# Patient Record
Sex: Male | Born: 1960 | Race: Black or African American | Hispanic: No | Marital: Married | Smoking: Never smoker
Health system: Southern US, Community
[De-identification: ages and names within clinical notes are randomized; demographics above are authoritative.]

## PROBLEM LIST (undated history)

## (undated) DIAGNOSIS — F32A Depression, unspecified: Secondary | ICD-10-CM

## (undated) DIAGNOSIS — E0500 Thyrotoxicosis with diffuse goiter without thyrotoxic crisis or storm: Secondary | ICD-10-CM

## (undated) DIAGNOSIS — F419 Anxiety disorder, unspecified: Secondary | ICD-10-CM

## (undated) DIAGNOSIS — F329 Major depressive disorder, single episode, unspecified: Secondary | ICD-10-CM

## (undated) DIAGNOSIS — T7840XA Allergy, unspecified, initial encounter: Secondary | ICD-10-CM

## (undated) DIAGNOSIS — E039 Hypothyroidism, unspecified: Secondary | ICD-10-CM

## (undated) HISTORY — DX: Anxiety disorder, unspecified: F41.9

## (undated) HISTORY — DX: Depression, unspecified: F32.A

## (undated) HISTORY — PX: KNEE SURGERY: SHX244

## (undated) HISTORY — DX: Allergy, unspecified, initial encounter: T78.40XA

---
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
## 2000-03-30 ENCOUNTER — Encounter: Payer: Self-pay | Admitting: Emergency Medicine

## 2000-03-30 ENCOUNTER — Emergency Department (HOSPITAL_COMMUNITY): Admission: EM | Admit: 2000-03-30 | Discharge: 2000-03-30 | Payer: Self-pay | Admitting: Emergency Medicine

## 2000-03-30 ENCOUNTER — Encounter: Payer: Self-pay | Admitting: Family Medicine

## 2000-10-03 ENCOUNTER — Ambulatory Visit (HOSPITAL_COMMUNITY): Admission: RE | Admit: 2000-10-03 | Discharge: 2000-10-03 | Payer: Self-pay | Admitting: Orthopedic Surgery

## 2000-10-03 ENCOUNTER — Encounter: Payer: Self-pay | Admitting: Orthopedic Surgery

## 2003-04-25 ENCOUNTER — Emergency Department (HOSPITAL_COMMUNITY): Admission: EM | Admit: 2003-04-25 | Discharge: 2003-04-25 | Payer: Self-pay | Admitting: Emergency Medicine

## 2004-08-17 ENCOUNTER — Emergency Department (HOSPITAL_COMMUNITY): Admission: EM | Admit: 2004-08-17 | Discharge: 2004-08-17 | Payer: Self-pay | Admitting: Emergency Medicine

## 2005-05-16 ENCOUNTER — Emergency Department (HOSPITAL_COMMUNITY): Admission: EM | Admit: 2005-05-16 | Discharge: 2005-05-16 | Payer: Self-pay | Admitting: Emergency Medicine

## 2006-06-01 ENCOUNTER — Emergency Department (HOSPITAL_COMMUNITY): Admission: EM | Admit: 2006-06-01 | Discharge: 2006-06-02 | Payer: Self-pay | Admitting: Emergency Medicine

## 2006-10-16 ENCOUNTER — Encounter: Admission: RE | Admit: 2006-10-16 | Discharge: 2006-10-16 | Payer: Self-pay | Admitting: Neurosurgery

## 2007-02-06 ENCOUNTER — Emergency Department (HOSPITAL_COMMUNITY): Admission: EM | Admit: 2007-02-06 | Discharge: 2007-02-06 | Payer: Self-pay | Admitting: Emergency Medicine

## 2010-02-27 ENCOUNTER — Emergency Department (HOSPITAL_COMMUNITY): Admission: EM | Admit: 2010-02-27 | Discharge: 2010-02-27 | Payer: Self-pay | Admitting: Emergency Medicine

## 2010-05-19 ENCOUNTER — Emergency Department (HOSPITAL_COMMUNITY): Admission: EM | Admit: 2010-05-19 | Discharge: 2010-05-19 | Payer: Self-pay | Admitting: Emergency Medicine

## 2010-10-31 LAB — DIFFERENTIAL
Eosinophils Absolute: 0.1 10*3/uL (ref 0.0–0.7)
Lymphocytes Relative: 17 % (ref 12–46)
Lymphs Abs: 1.8 10*3/uL (ref 0.7–4.0)
Monocytes Relative: 4 % (ref 3–12)
Neutrophils Relative %: 78 % — ABNORMAL HIGH (ref 43–77)

## 2010-10-31 LAB — CBC
HCT: 45.5 % (ref 39.0–52.0)
Hemoglobin: 15.3 g/dL (ref 13.0–17.0)
MCV: 87.4 fL (ref 78.0–100.0)
RDW: 13.8 % (ref 11.5–15.5)

## 2010-10-31 LAB — URINALYSIS, ROUTINE W REFLEX MICROSCOPIC
Bilirubin Urine: NEGATIVE
Glucose, UA: NEGATIVE mg/dL
Hgb urine dipstick: NEGATIVE
Leukocytes, UA: NEGATIVE
Protein, ur: 30 mg/dL — AB
Urobilinogen, UA: 0.2 mg/dL (ref 0.0–1.0)
pH: 8.5 — ABNORMAL HIGH (ref 5.0–8.0)

## 2010-10-31 LAB — COMPREHENSIVE METABOLIC PANEL
ALT: 22 U/L (ref 0–53)
Albumin: 4.3 g/dL (ref 3.5–5.2)
Alkaline Phosphatase: 56 U/L (ref 39–117)
BUN: 13 mg/dL (ref 6–23)
Calcium: 9.7 mg/dL (ref 8.4–10.5)
Creatinine, Ser: 1.36 mg/dL (ref 0.4–1.5)
Potassium: 2.9 mEq/L — ABNORMAL LOW (ref 3.5–5.1)
Total Bilirubin: 0.9 mg/dL (ref 0.3–1.2)
Total Protein: 7.2 g/dL (ref 6.0–8.3)

## 2010-10-31 LAB — LIPASE, BLOOD: Lipase: 44 U/L (ref 11–59)

## 2010-10-31 LAB — URINE MICROSCOPIC-ADD ON

## 2010-11-03 LAB — DIFFERENTIAL
Eosinophils Absolute: 0.1 10*3/uL (ref 0.0–0.7)
Eosinophils Relative: 1 % (ref 0–5)
Lymphocytes Relative: 16 % (ref 12–46)
Monocytes Absolute: 0.7 10*3/uL (ref 0.1–1.0)
Neutro Abs: 8.5 10*3/uL — ABNORMAL HIGH (ref 1.7–7.7)
Neutrophils Relative %: 76 % (ref 43–77)

## 2010-11-03 LAB — CBC
HCT: 43.3 % (ref 39.0–52.0)
MCV: 86.5 fL (ref 78.0–100.0)
Platelets: 247 10*3/uL (ref 150–400)
WBC: 11.2 10*3/uL — ABNORMAL HIGH (ref 4.0–10.5)

## 2010-11-03 LAB — URINE MICROSCOPIC-ADD ON

## 2010-11-03 LAB — URINALYSIS, ROUTINE W REFLEX MICROSCOPIC
Bilirubin Urine: NEGATIVE
Glucose, UA: NEGATIVE mg/dL
Ketones, ur: NEGATIVE mg/dL
Leukocytes, UA: NEGATIVE
Nitrite: NEGATIVE
Protein, ur: 30 mg/dL — AB
Specific Gravity, Urine: 1.022 (ref 1.005–1.030)
Urobilinogen, UA: 0.2 mg/dL (ref 0.0–1.0)
pH: 7 (ref 5.0–8.0)

## 2010-11-03 LAB — BASIC METABOLIC PANEL
Creatinine, Ser: 1.41 mg/dL (ref 0.4–1.5)
GFR calc Af Amer: >60 mL/min (ref >60)
Glucose, Bld: 155 mg/dL — ABNORMAL HIGH (ref 70–99)
Potassium: 3.5 mEq/L (ref 3.5–5.1)
Sodium: 139 mEq/L (ref 135–145)

## 2011-02-25 IMAGING — CT CT ABD-PELV W/O CM
2 of 4 series · 17 of 46 positions shown, 19 images · non-contrast
Comparison: None.

CLINICAL DATA: Abdominal pain.  Right flank pain.

CT ABDOMEN AND PELVIS WITHOUT CONTRAST
TECHNIQUE: Multidetector CT imaging of the abdomen and pelvis was
performed following the standard protocol without intravenous
contrast.

[Series 2: under 200# stone no prev · axial · 0.74mm/px · z∈[-502,-82]mm · 14 of 93 slices shown, 16 images]
[im 5/93  soft-tissue]
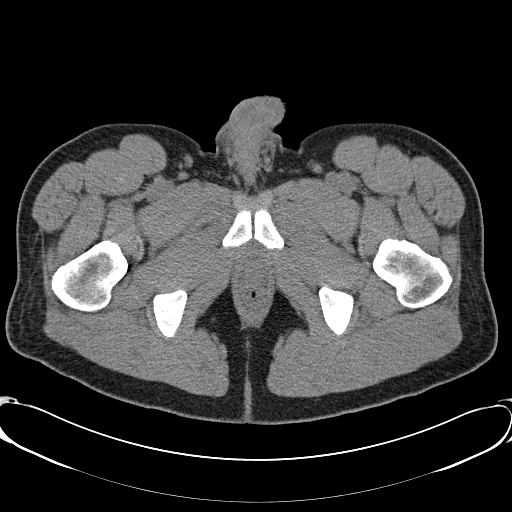
[im 5/93  bone]
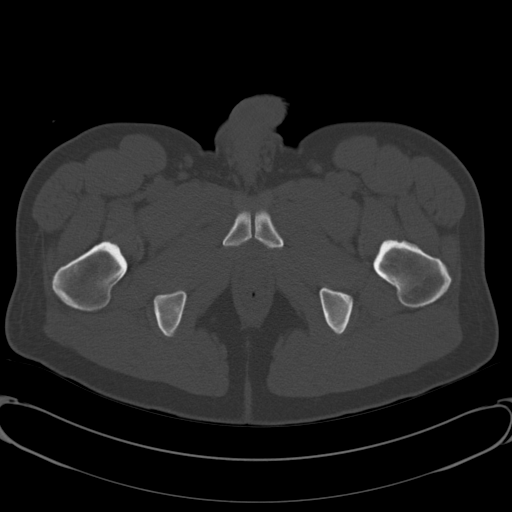
[im 13/93  soft-tissue]
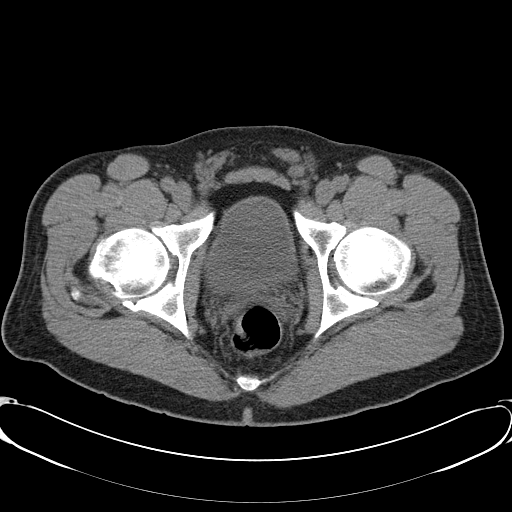
[im 17/93  soft-tissue]
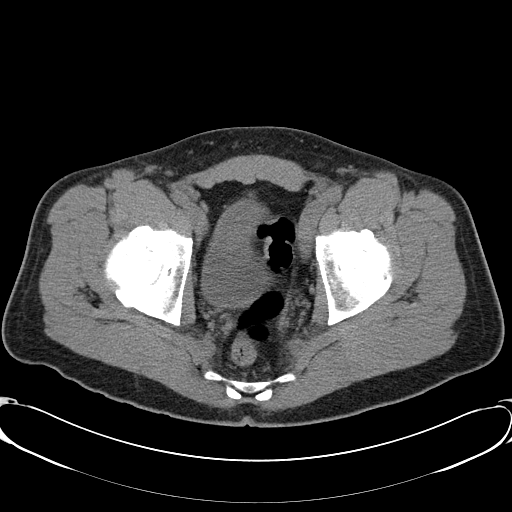
[im 25/93  soft-tissue]
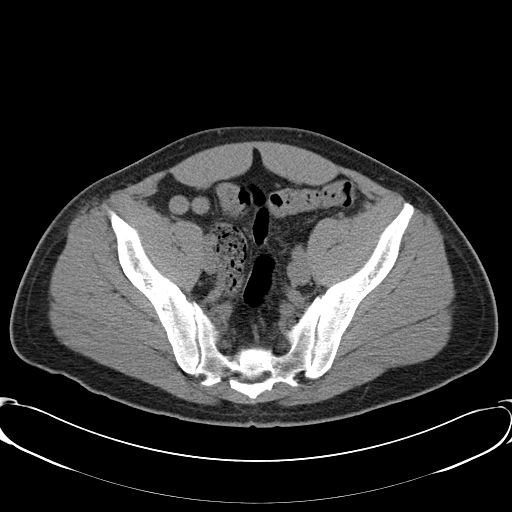
[im 33/93  soft-tissue]
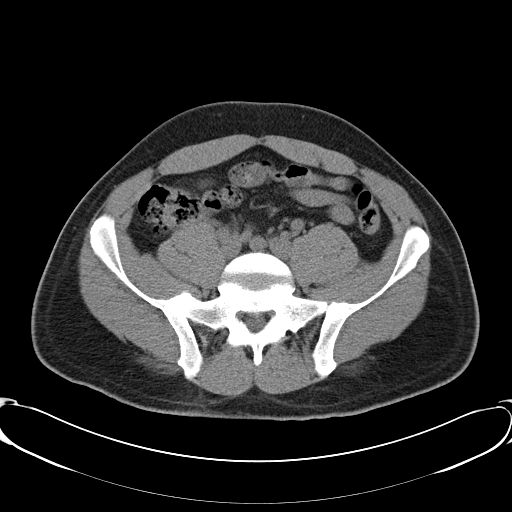
[im 37/93  soft-tissue]
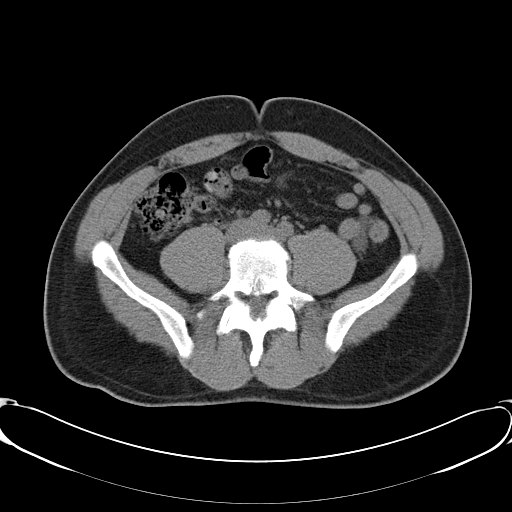
[im 45/93  soft-tissue]
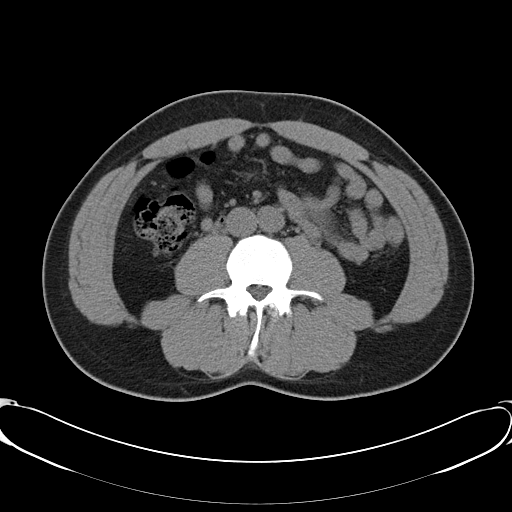
[im 49/93  soft-tissue]
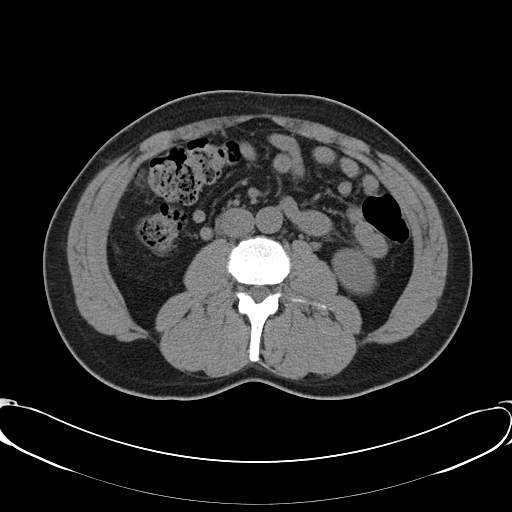
[im 57/93  soft-tissue]
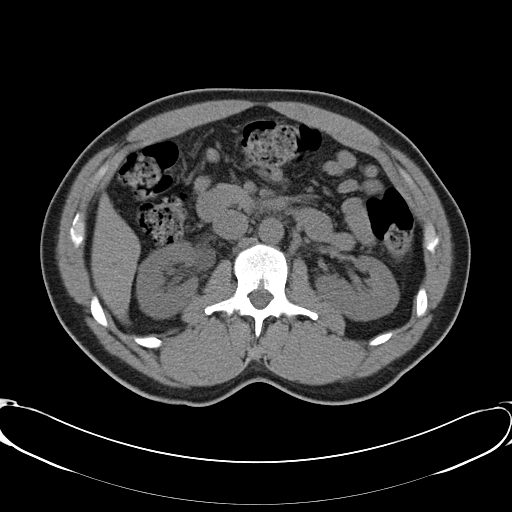
[im 57/93  bone]
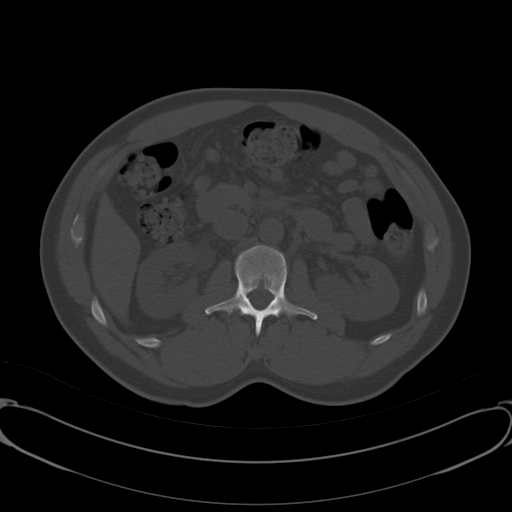
[im 61/93  soft-tissue]
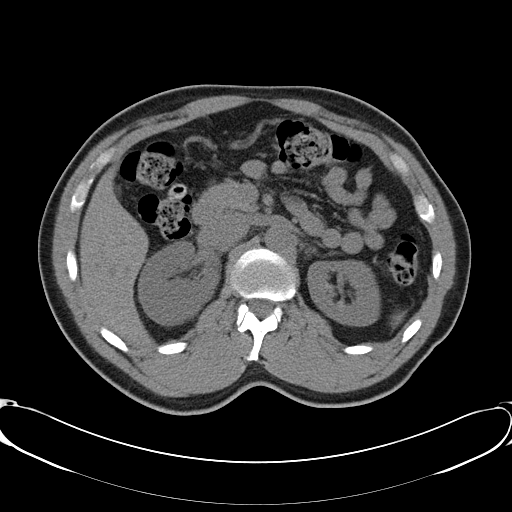
[im 69/93  soft-tissue]
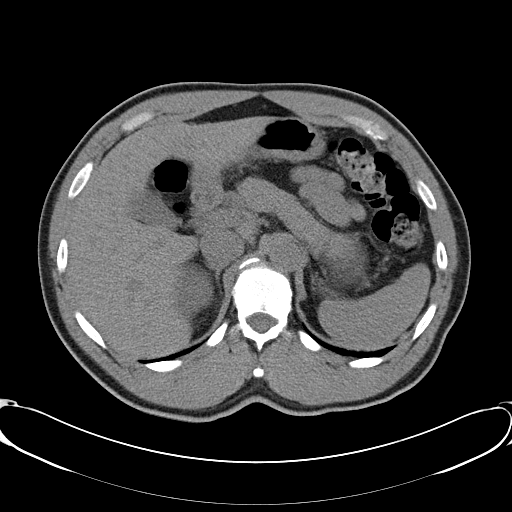
[im 77/93  soft-tissue]
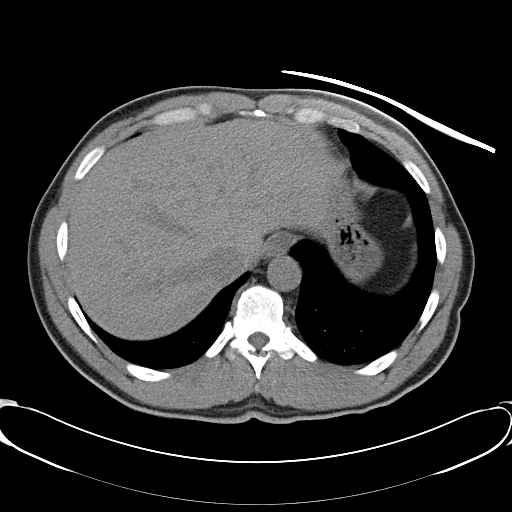
[im 81/93  soft-tissue]
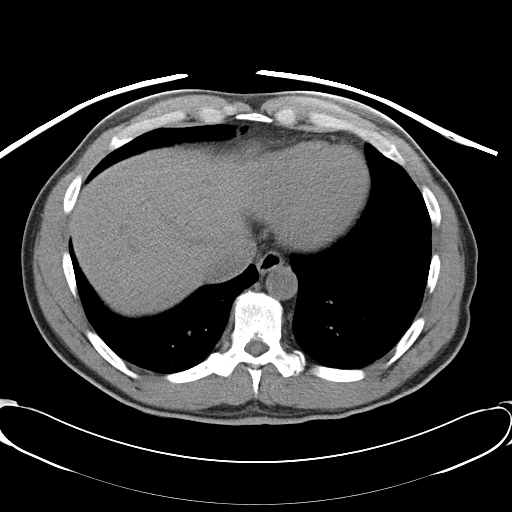
[im 89/93  soft-tissue]
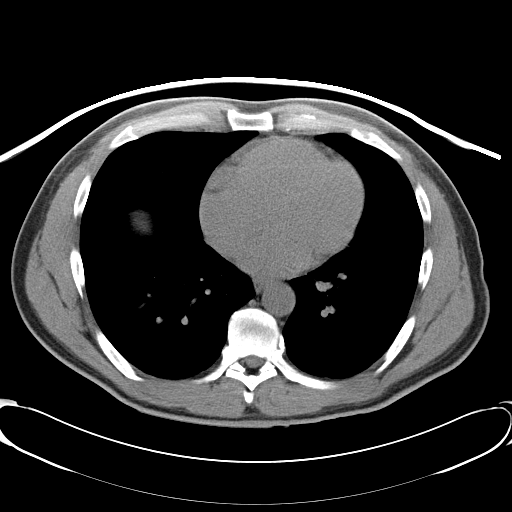

[Series 602: coronal images · coronal · 0.91mm/px · 3 of 79 slices shown]
[im 27/79  soft-tissue]
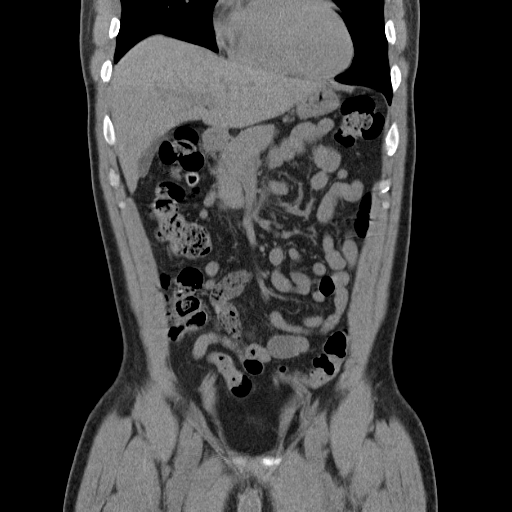
[im 35/79  soft-tissue]
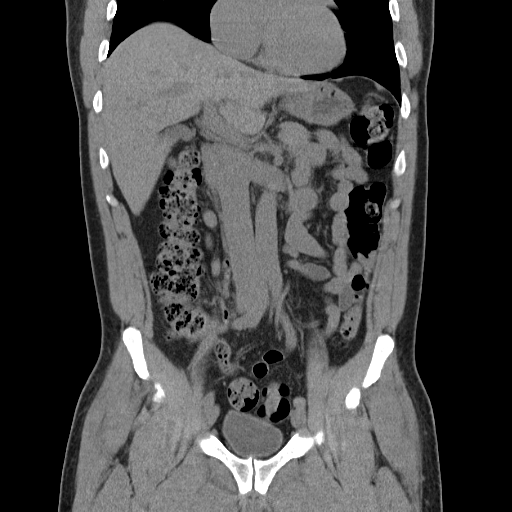
[im 44/79  soft-tissue]
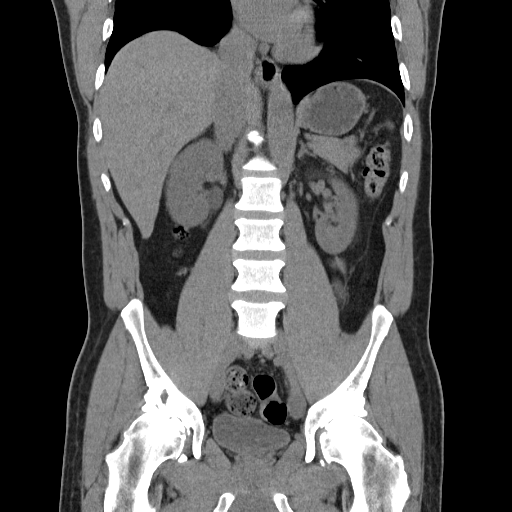

[17 of 46 positions shown; findings below may reference images not displayed]

FINDINGS: Minimal dependent atelectasis is present at the lung
bases bilaterally.  The heart size is normal.  No significant
pleural or pericardial effusion is present.

The liver and spleen are within normal limits.  The stomach,
duodenum, and pancreas are within normal limits.  The common bile
duct and gallbladder are normal.  The adrenal glands are normal
bilaterally.  Mild right-sided hydronephrosis is present.  A 4.5 mm
obstructing stone is present at the level of the L3-4 disc. The
ureter is dilated above this point.  No significant distal stones
or stone fragments are present.  A punctate nonobstructing stone is
noted at the upper pole of the left kidney.  The left ureter is of
normal size and caliber throughout its course into the anatomic
pelvis.

Rectosigmoid colon is within normal limits.  The remainder the
colon is unremarkable.  The appendix is visualized and normal.
Urinary bladder is unremarkable.  No significant adenopathy or free
fluid is present.

Bone windows demonstrate advanced degenerative changes of both
hips.  There are large subchondral cysts or geodes.  There may be
erosions medially.  Significant loss of joint space is present.
IMPRESSION: 1.  Obstructing 4.5 mm right mid ureteral stone with mild right-
sided hydronephrosis.
2.  Nonobstructing punctate left upper pole kidney stone.
3.  Advanced degenerative changes of both hips.  While this could
be due to post-traumatic osteoarthritis and patient of this age,
inflammatory arthritis such as CPPD or gout is also considered.

## 2011-06-04 LAB — WOUND CULTURE

## 2012-07-13 ENCOUNTER — Emergency Department (HOSPITAL_COMMUNITY)
Admission: EM | Admit: 2012-07-13 | Discharge: 2012-07-13 | Disposition: A | Discharge status: Home / Self Care | Payer: Self-pay | Attending: Emergency Medicine | Admitting: Emergency Medicine

## 2012-07-13 ENCOUNTER — Encounter (HOSPITAL_COMMUNITY): Payer: Self-pay | Admitting: *Deleted

## 2012-07-13 ENCOUNTER — Emergency Department (HOSPITAL_COMMUNITY): Payer: Self-pay

## 2012-07-13 DIAGNOSIS — R0781 Pleurodynia: Secondary | ICD-10-CM

## 2012-07-13 DIAGNOSIS — R079 Chest pain, unspecified: Secondary | ICD-10-CM | POA: Insufficient documentation

## 2012-07-13 MED ORDER — IBUPROFEN 800 MG PO TABS
800.0000 mg | ORAL_TABLET | Freq: Once | ORAL | Status: AC
  Administered 2012-07-13: 800 mg via ORAL
  Filled 2012-07-13: qty 1

## 2012-07-13 MED ORDER — HYDROCODONE-ACETAMINOPHEN 5-325 MG PO TABS: 1.0000 | ORAL_TABLET | Freq: Four times a day (QID) | ORAL | Status: DC | PRN

## 2012-07-13 NOTE — ED Provider Notes (Signed)
History     CSN: [id number]  Arrival date & time 07/13/12  4098   First MD Initiated Contact with Patient 07/13/12 1033      No chief complaint on file.   (Consider location/radiation/quality/duration/timing/severity/associated sxs/prior treatment) HPI Comments: 51 year old male with a significant past medical history presents emergency department with chief complaint of left rib pain.  Onset of symptoms began yesterday after patient was wrestling with a martial Water engineer.  Since the incident left rib pain occurs when coughing or laughing.  He denies any pleuritic pain, chest pain, numbness tingling or weakness, difficulty with range of motion.  Other complaints at this time.  The history is provided by the patient.    History reviewed. No pertinent past medical history.  Past Surgical History  Procedure Date  . Knee surgery     cyst removed/fluid buildup     History reviewed. No pertinent family history.  History  Substance Use Topics  . Smoking status: Never Smoker   . Smokeless tobacco: Never Used  . Alcohol Use: Yes      Review of Systems  Constitutional: Negative for fever, chills and appetite change.  HENT: Negative for congestion.   Eyes: Negative for visual disturbance.  Respiratory: Negative for shortness of breath.   Cardiovascular: Negative for chest pain and leg swelling.  Gastrointestinal: Negative for abdominal pain.  Genitourinary: Negative for dysuria, urgency and frequency.  Musculoskeletal: Positive for back pain (rib pain ).  Neurological: Negative for dizziness, syncope, weakness, light-headedness, numbness and headaches.  Psychiatric/Behavioral: Negative for confusion.  All other systems reviewed and are negative.    Allergies  Pork-derived products  Home Medications   Current Outpatient Rx  Name  Route  Sig  Dispense  Refill  . IBUPROFEN 200 MG PO TABS   Oral   Take 200 mg by mouth every 6 (six) hours as needed. Pain            BP 124/80  Pulse 60  Temp 98.2 F (36.8 C) (Oral)  Resp 18  SpO2 100%  Physical Exam  Nursing note and vitals reviewed. Constitutional: He appears well-developed and well-nourished. No distress.  HENT:  Head: Normocephalic and atraumatic.  Eyes: Conjunctivae normal and EOM are normal.  Neck: Normal range of motion. Neck supple.  Cardiovascular:       Intact distal pulses, capillary refill < 3 seconds  Pulmonary/Chest:       ttp along left 4 or 5th rib  Musculoskeletal:       All extremities with normal ROM  Neurological:       No sensory deficit  Skin: He is not diaphoretic.       Skin intact, no tenting    ED Course  Procedures (including critical care time)  Labs Reviewed - No data to display Dg Ribs Unilateral W/chest Left  07/13/2012  *RADIOLOGY REPORT*  Clinical Data: Left rib pain.  LEFT RIBS AND CHEST - 3+ VIEW  Comparison: None  Findings: The cardiac silhouette, mediastinal and hilar contours are normal.  The lungs are clear.  No pleural effusion, pleural thickening or pneumothorax.  Dedicated views of the left ribs demonstrate no definite acute left- sided rib fractures.  IMPRESSION:  1.  No acute cardiopulmonary findings. 2.  No definite acute left-sided rib fractures.   Original Report Authenticated By: Rudie Meyer, M.D.      No diagnosis found.    MDM  Rib pain   Patient X-Ray negative for obvious fracture or dislocation. Pt  advised to follow up with orthopedics if symptoms persist for possibility of missed fracture diagnosis.  Conservative therapy recommended and discussed. Patient will be dc home & is agreeable with above plan.         Jaci Carrel, [place] 07/13/12 1144

## 2012-07-13 NOTE — ED Provider Notes (Signed)
Medical screening examination/treatment/procedure(s) were performed by non-physician practitioner and as supervising physician I was immediately available for consultation/collaboration.  Jones Skene, M.D.      Jones Skene, MD 07/13/12 1304
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
## 2016-02-29 ENCOUNTER — Encounter (HOSPITAL_COMMUNITY): Payer: Self-pay

## 2016-02-29 ENCOUNTER — Emergency Department (HOSPITAL_COMMUNITY): Payer: Self-pay

## 2016-02-29 ENCOUNTER — Emergency Department (HOSPITAL_COMMUNITY)
Admission: EM | Admit: 2016-02-29 | Discharge: 2016-02-29 | Disposition: A | Discharge status: Home / Self Care | Payer: Self-pay | Attending: Emergency Medicine | Admitting: Emergency Medicine

## 2016-02-29 DIAGNOSIS — Z791 Long term (current) use of non-steroidal anti-inflammatories (NSAID): Secondary | ICD-10-CM | POA: Insufficient documentation

## 2016-02-29 DIAGNOSIS — F439 Reaction to severe stress, unspecified: Secondary | ICD-10-CM | POA: Insufficient documentation

## 2016-02-29 DIAGNOSIS — F329 Major depressive disorder, single episode, unspecified: Secondary | ICD-10-CM | POA: Insufficient documentation

## 2016-02-29 DIAGNOSIS — Z5181 Encounter for therapeutic drug level monitoring: Secondary | ICD-10-CM | POA: Insufficient documentation

## 2016-02-29 DIAGNOSIS — R4589 Other symptoms and signs involving emotional state: Secondary | ICD-10-CM

## 2016-02-29 DIAGNOSIS — M1611 Unilateral primary osteoarthritis, right hip: Secondary | ICD-10-CM | POA: Insufficient documentation

## 2016-02-29 DIAGNOSIS — M25551 Pain in right hip: Secondary | ICD-10-CM

## 2016-02-29 LAB — URINALYSIS, ROUTINE W REFLEX MICROSCOPIC
BILIRUBIN URINE: NEGATIVE
Glucose, UA: NEGATIVE mg/dL
HGB URINE DIPSTICK: NEGATIVE
KETONES UR: NEGATIVE mg/dL
Leukocytes, UA: NEGATIVE
NITRITE: NEGATIVE
PH: 7 (ref 5.0–8.0)
Protein, ur: NEGATIVE mg/dL
Specific Gravity, Urine: 1.022 (ref 1.005–1.030)

## 2016-02-29 LAB — ETHANOL: Alcohol, Ethyl (B): <5 mg/dL (ref <5)

## 2016-02-29 LAB — SALICYLATE LEVEL

## 2016-02-29 LAB — CBC
HEMATOCRIT: 41.7 % (ref 39.0–52.0)
Hemoglobin: 13.9 g/dL (ref 13.0–17.0)
MCH: 27.9 pg (ref 26.0–34.0)
MCHC: 33.3 g/dL (ref 30.0–36.0)
MCV: 83.6 fL (ref 78.0–100.0)
Platelets: 265 10*3/uL (ref 150–400)
RBC: 4.99 MIL/uL (ref 4.22–5.81)
RDW: 13.2 % (ref 11.5–15.5)
WBC: 5.4 10*3/uL (ref 4.0–10.5)

## 2016-02-29 LAB — BASIC METABOLIC PANEL
ANION GAP: 4 — AB (ref 5–15)
BUN: 11 mg/dL (ref 6–20)
CALCIUM: 9.2 mg/dL (ref 8.9–10.3)
CO2: 26 mmol/L (ref 22–32)
Chloride: 110 mmol/L (ref 101–111)
Creatinine, Ser: 0.95 mg/dL (ref 0.61–1.24)
GFR calc Af Amer: >60 mL/min (ref >60)
Glucose, Bld: 88 mg/dL (ref 65–99)
POTASSIUM: 4.3 mmol/L (ref 3.5–5.1)
SODIUM: 140 mmol/L (ref 135–145)

## 2016-02-29 LAB — RAPID URINE DRUG SCREEN, HOSP PERFORMED
Amphetamines: NOT DETECTED
Barbiturates: NOT DETECTED
Benzodiazepines: NOT DETECTED
Cocaine: NOT DETECTED
Opiates: NOT DETECTED
Tetrahydrocannabinol: POSITIVE — AB

## 2016-02-29 LAB — CBG MONITORING, ED: Glucose-Capillary: 81 mg/dL (ref 65–99)

## 2016-02-29 LAB — ACETAMINOPHEN LEVEL: Acetaminophen (Tylenol), Serum: <10 ug/mL — ABNORMAL LOW (ref 10–30)

## 2016-02-29 NOTE — Discharge Instructions (Signed)
Community Resource Guide Outpatient Counseling/Substance Abuse Adult °The United Way’s “211” is a great source of information about community services available.  Access by dialing 2-1-1 from anywhere in [place], or by website -  www.nc211.org.  ° °Other Local Resources (Updated 08/2015) ° °Crisis Hotlines °  °Services  ° °  °Area Served  °[facility] • Crisis Hotline, available 24 hours a day, 7 days a week: 800-939-5911 [place], [place]  ° [facility] • Crisis Hotline, available 24 hours a day, 7 days a week: 866-275-9552 [place], [place]  °[facility] • Suicide Prevention Hotline, available 24 hours a day, 7 days a week: 800-273-8255 [place], [place]  °[facility] ° • Crisis Hotline, available 24 hours a day, 7 days a week: 336-676-6840 [place], [place] °  °[facility] • Crisis Hotline, available 24 hours a day, 7 days a week: 800-256-2452 All °  °[facility] • Crisis Hotline, available 24 hours a day, 7 days a week: 877-626-1772 All  ° °Other Local Resources (Updated 08/2015) ° °Outpatient Counseling/ Substance Abuse Programs  °Services  ° °  °Address and Phone Number  °[facility] ° • Options include Individual counseling, group counseling, intensive outpatient program (several hours a day, several days a week) °• Offers depression assessments °• Provides methadone maintenance program 336-333-6860 °[address] °[place], [place] [address] °  °[facility] ° • Offers partial hospitalization/day treatment and DUI/DWI programs °• Accepts Medicare, private insurance 336-299-4655 °[address], [address] °[place], [place] [address]  °[facility] ° ° • Services include intensive outpatient program (several hours a day, several days a week), outpatient treatment, DUI/DWI services, family education °• Also has some services specifically for Veterans °• Offers transitional housing   [phone] °[address] °[place], [place] [address] °  °  °[facility] • Accepts Medicare, private pay, and private insurance [phone] °[address] °[place], [place] [address]  °[facility] • Services include individual counseling, substance abuse intensive outpatient program (several hours a day, several days a week), day treatment °• Accepts Medicare, Medicaid, private insurance 336-271-5888 °[address] °[place], [place] [address]  °[facility] ° • Offers substance abuse intensive outpatient program (several hours a day, several days a week), partial hospitalization program 336-832-9800 °[address] °[place], [place] [address] ° °336-349-4454 °[address] °[place], [place] [address] ° °336-386-3795 °[address] °[place], [place] [address] ° °[phone] °[address] °[place], [place] [address]  °[facility] • Individual counseling only °• Accepts private insurance only 336-292-1510 °[address] °[place], [place] [address]  °[facility] • Methadone maintenance program [phone] °[address] °[place], [place] [address]  °[facility] • [facility]-In Clinic providing substance abuse and mental health counseling °• Accepts Medicaid, Medicare, private insurance °• Offers sliding scale for uninsured 336-342-8316 °[address] °[place], [place]   °[facility] • Offers individual counseling, and intensive in-home services 336-347-7415 °[address] °[place], [place] [address]  °[facility] • Offers individual counseling, family counseling, group therapy, domestic violence counseling, consumer credit counseling °• Accepts Medicare, Medicaid, private insurance °• Offers sliding scale for uninsured 336-387-6161 °[address] °[place], [place] [address] ° °336-889-6161 °[place], [address] °[place],  [address]  °[facility] • Offers individual, family   and group counseling  3 locations - [place], [place], and [place]  [phone]  [address] [place], [place] [address]  [address] [place], [place] [address]  [address] [place], [place] [address]  [facility]    Offers psychiatric assessment, 8-week Intensive Outpatient Program (several hours a day, several times a week, daytime or evenings), early recovery group, family Program, medication management  Private pay or private insurance only [phone], or  [phone] [address] [place], [place] [address]  [facility] individual, couples and family counseling  Accepts Medicaid, private insurance, and sliding scale for uninsured [phone] [address] [place], [place] [address]  Len Blalock, MD  Individual counseling  Private insurance [phone] [address] [place], [place] [address]  [facility]   Offers assessment, substance abuse treatment, and behavioral health treatment [phone] [address] [place], [place] [address]  [facility]  Individual counseling  Accepts private insurance [phone] [address] [place], [place] [address]  [facility]  Individual counseling  Delene Loll, private insurance [phone] [address] [place], [place] [address]  [facility]    Offers intensive outpatient program (several hours a day, several times a week)  Private pay, private insurance [phone] [facility] [place], [place]  [facility]  Individual counseling  Medicare, private insurance [phone] [address] [place], [place] [address]  [facility]    Offers intensive outpatient program (several hours a day, several times a week) and partial hospitalization  program [phone] [address] [place], [place] [address]  Emerson Monte, MD  Individual counseling [phone] [address] [place], [place] [address]  [facility]  Offers Christian counseling to individuals, couples, and families  Accepts Medicare and private insurance; offers sliding scale for uninsured [phone] [address] [place], [place] [address]  [facility]  [place] counseling [phone] [address] [place], [place] [address]  [facility] crisis counseling, individual counseling, group therapy, in-home therapy, domestic violence services, day treatment, DWI services, Administrator, arts ([facility]), Assertive Community Treatment Team (ACTT), substance abuse Intensive Outpatient Program (several hours a day, several times a week)  2 locations - [place] and [place] [phone] [address] [place], [place] [address]  [phone] [address] [place], [place] [address]  [facility]     Individual counseling and group therapy  Accepts private insurance, [place], [place] [phone] [address] [place], [place]  [facility]  Offers individual and family counseling  Offers LGBTQ services  Accepts private insurance and private pay [phone] [address] [place], [place] [address]  [facility]    Offers individual counseling, group therapy, and outpatient detox  Accepts private insurance [phone] [address] [place], [place]  [facility]  Individual counseling  Accepts Medicare, private insurance [phone] [address] [place], [place] [address]  [facility]  Individual counseling  Accepts Medicare, private insurance [phone] [address] [place], [place] [address]  [facility]   Offers substance abuse  Intensive Outpatient Program (several hours a day, several times a week) [phone], or [phone] [place], [place]  Hip Pain Your hip is the joint between your upper legs and your lower pelvis. The bones, cartilage, tendons, and muscles of your hip joint perform a lot of work each day supporting your body weight  and allowing you to move around. Hip pain can range from a minor ache to severe pain in one or both of your hips. Pain may be felt on the inside of the hip joint near the groin, or the outside near the buttocks and upper thigh. You may have swelling or stiffness as well.  HOME CARE INSTRUCTIONS   Take medicines only as directed by your health care provider.  Apply ice to the injured area:  Put ice in a plastic bag.  Place a towel between your skin and the bag.  Leave the ice on for 15-20 minutes at a time, 3-4 times a day.  Keep your leg raised (elevated) when possible to lessen swelling.  Avoid activities that cause pain.  Follow specific exercises as directed by your health care provider.  Sleep with a pillow between your legs on your most comfortable side.  Record how often you have hip pain, the location of the pain, and what it feels like. SEEK MEDICAL CARE IF:   You are unable to put weight on your leg.  Your hip is red or swollen or very tender to touch.  Your pain or swelling continues or worsens after 1 week.  You have increasing difficulty walking.  You have a fever. SEEK IMMEDIATE MEDICAL CARE IF:   You have fallen.  You have a sudden increase in pain and swelling in your hip. MAKE SURE YOU:   Understand these instructions.  Will watch your condition.  Will get help right away if you are not doing well or get worse.   This information is not intended to replace advice given to you by your health care provider. Make sure you discuss any questions you have with your health care provider.   Document Released: 01/22/2010 Document Revised:  08/25/2014 Document Reviewed: 03/31/2013 Elsevier Interactive Patient Education [employer]2016 Elsevier Inc.

## 2016-02-29 NOTE — ED Notes (Signed)
Pt reports he has been going through some issues related to job and family.  Pt reported that it was overwhelming and has caused him lots of anxiety.  Pt denies SI at this time.  Pt a/o x 4.
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
## 2016-02-29 NOTE — ED Notes (Signed)
Patient transported to X-ray 
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
## 2016-11-18 DIAGNOSIS — M62838 Other muscle spasm: Secondary | ICD-10-CM | POA: Diagnosis not present

## 2016-11-18 DIAGNOSIS — E039 Hypothyroidism, unspecified: Secondary | ICD-10-CM | POA: Diagnosis not present

## 2016-11-18 DIAGNOSIS — Z79899 Other long term (current) drug therapy: Secondary | ICD-10-CM | POA: Insufficient documentation

## 2016-11-18 DIAGNOSIS — M79641 Pain in right hand: Secondary | ICD-10-CM | POA: Diagnosis present

## 2016-11-19 ENCOUNTER — Emergency Department (HOSPITAL_COMMUNITY)
Admission: EM | Admit: 2016-11-19 | Discharge: 2016-11-19 | Disposition: A | Discharge status: Home / Self Care | Payer: Non-veteran care | Attending: Emergency Medicine | Admitting: Emergency Medicine

## 2016-11-19 ENCOUNTER — Encounter (HOSPITAL_COMMUNITY): Payer: Self-pay

## 2016-11-19 DIAGNOSIS — M62838 Other muscle spasm: Secondary | ICD-10-CM

## 2016-11-19 HISTORY — DX: Hypothyroidism, unspecified: E03.9

## 2016-11-19 HISTORY — DX: Thyrotoxicosis with diffuse goiter without thyrotoxic crisis or storm: E05.00

## 2016-11-19 MED ORDER — CYCLOBENZAPRINE HCL 10 MG PO TABS: 10.0000 mg | ORAL_TABLET | Freq: Three times a day (TID) | ORAL | 0 refills | Status: AC | PRN

## 2016-11-19 NOTE — ED Triage Notes (Signed)
Pt complains of right hand cramping and numbness since yesterday, he also states that his left shoulder has a knot in it, no injury Pt was dx with Graves dx in January

## 2016-11-19 NOTE — ED Provider Notes (Signed)
[facility]-EMERGENCY DEPT Provider Note   CSN: [id number] Arrival date & time: 11/18/16  2236   By signing my name below, I, Wesley Alexander, attest that this documentation has been prepared under the direction and in the presence of Wesley Crease, MD  Electronically Signed: Clovis Alexander, ED Scribe. 11/19/16. 1:10 AM.   History   Chief Complaint Chief Complaint  Patient presents with  . Extremity Weakness    HPI Comments:  Wesley Alexander is a 56 y.o. male, with a PMHx of Graves disease, who presents to the Emergency Department complaining of acute onset, intermittent right hand cramping x yesterday. Pt reports associated tingling and subjective weakness. He additionally reports left shoulder discomfort after visiting his chiropractor and a knot to the left side of his neck. No alleviating or modifying factors noted. Pt denies any other associated symptoms. No other complaints noted.    The history is provided by the patient. No language interpreter was used.    Past Medical History:  Diagnosis Date  . Graves disease   . Hypothyroid     There are no active problems to display for this patient.   Past Surgical History:  Procedure Laterality Date  . KNEE SURGERY     cyst removed/fluid buildup        Home Medications    Prior to Admission medications   Medication Sig Start Date End Date Taking? Authorizing Provider  acetaminophen (TYLENOL) 500 MG tablet Take 1,000 mg by mouth every 6 (six) hours as needed for moderate pain.    Historical Provider, MD  cyclobenzaprine (FLEXERIL) 10 MG tablet Take 1 tablet (10 mg total) by mouth 3 (three) times daily as needed for muscle spasms. 11/19/16   Wesley Crease, MD  HYDROcodone-acetaminophen (NORCO/VICODIN) 5-325 MG per tablet Take 1 tablet by mouth every 6 (six) hours as needed for pain. Patient not taking: Reported on 02/29/2016 07/13/12   Lisette Paz, PA-C  ibuprofen (ADVIL,MOTRIN) 200 MG tablet Take 200 mg by mouth every 6  (six) hours as needed. Pain    Historical Provider, MD    Family History History reviewed. No pertinent family history.  Social History Social History  Substance Use Topics  . Smoking status: Never Smoker  . Smokeless tobacco: Never Used  . Alcohol use Yes     Allergies   Pork-derived products   Review of Systems Review of Systems  Musculoskeletal: Positive for myalgias.  Neurological: Negative for numbness.  All other systems reviewed and are negative.    Physical Exam Updated Vital Signs BP 125/88 (BP Location: Left Arm)   Pulse (!) 102   Temp 98.3 F (36.8 C) (Oral)   Resp 16   Ht  (1.753 m)   Wt 134 lb 4 oz (60.9 kg)   SpO2 100%   BMI 19.83 kg/m   Physical Exam  Constitutional: He is oriented to person, place, and time. He appears well-developed and well-nourished. No distress.  HENT:  Head: Normocephalic and atraumatic.  Right Ear: Hearing normal.  Left Ear: Hearing normal.  Nose: Nose normal.  Mouth/Throat: Oropharynx is clear and moist and mucous membranes are normal.  Eyes: Conjunctivae and EOM are normal. Pupils are equal, round, and reactive to light.  Neck: Normal range of motion. Neck supple.  2 cm mobile lipoma to left posterior neck   Cardiovascular: Regular rhythm, S1 normal and S2 normal.  Exam reveals no gallop and no friction rub.   No murmur heard. Pulmonary/Chest: Effort normal and breath sounds  normal. No respiratory distress. He exhibits no tenderness.  Abdominal: Soft. Normal appearance and bowel sounds are normal. There is no hepatosplenomegaly. There is no tenderness. There is no rebound, no guarding, no tenderness at McBurney's point and negative Murphy's sign. No hernia.  Musculoskeletal: Normal range of motion.  Neurological: He is alert and oriented to person, place, and time. He has normal strength. No cranial nerve deficit or sensory deficit. Coordination normal. GCS eye subscore is 4. GCS verbal subscore is 5. GCS motor  subscore is 6.  Extraocular muscle movement: normal No visual field cut Pupils: equal and reactive both direct and consensual response is normal No nystagmus present    Sensory function is intact to light touch, pinprick Proprioception intact  Grip strength 5/5 symmetric in upper extremities No pronator drift Normal finger to nose bilaterally  Lower extremity strength 5/5 against gravity Normal heel to shin bilaterally  Gait: normal   Skin: Skin is warm, dry and intact. No rash noted. No cyanosis.  Psychiatric: He has a normal mood and affect. His speech is normal and behavior is normal. Thought content normal.  Nursing note and vitals reviewed.    ED Treatments / Results  DIAGNOSTIC STUDIES:  Oxygen Saturation is 100% on RA, normal by my interpretation.    COORDINATION OF CARE:  1:09 AM Discussed treatment plan with pt at bedside and pt agreed to plan.  Labs (all labs ordered are listed, but only abnormal results are displayed) Labs Reviewed - No data to display  EKG  EKG Interpretation None       Radiology No results found.  Procedures Procedures (including critical care time)  Medications Ordered in ED Medications - No data to display   Initial Impression / Assessment and Plan / ED Course  I have reviewed the triage vital signs and the nursing notes.  Pertinent labs & imaging results that were available during my care of the patient were reviewed by me and considered in my medical decision making (see chart for details).     Presents with complaints of multiple episodes of cramping of his left hand today. He was concerned about the possibility of stroke. The history that he gives is not consistent with stroke. He has a normal neurologic exam. Patient reassured, does not require any workup at this time.  Final Clinical Impressions(s) / ED Diagnoses   Final diagnoses:  Muscle spasm    New Prescriptions New Prescriptions   CYCLOBENZAPRINE  (FLEXERIL) 10 MG TABLET    Take 1 tablet (10 mg total) by mouth 3 (three) times daily as needed for muscle spasms.  I personally performed the services described in this documentation, which was scribed in my presence. The recorded information has been reviewed and is accurate.     Wesley Crease, MD 11/19/16 [phone]
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
## 2019-12-19 ENCOUNTER — Ambulatory Visit: Payer: Non-veteran care | Admitting: Physician Assistant

## 2019-12-19 ENCOUNTER — Other Ambulatory Visit: Payer: Self-pay

## 2019-12-19 VITALS — BP 119/86 | HR 78 | Temp 98.1°F | Ht 69.0 in | Wt 160.0 lb

## 2019-12-19 DIAGNOSIS — L03115 Cellulitis of right lower limb: Secondary | ICD-10-CM

## 2019-12-19 MED ORDER — CEPHALEXIN 500 MG PO CAPS: 500.0000 mg | ORAL_CAPSULE | Freq: Four times a day (QID) | ORAL | 0 refills | Status: AC

## 2019-12-19 NOTE — Patient Instructions (Signed)
Cellulitis, Adult  Cellulitis is a skin infection. The infected area is usually warm, red, swollen, and tender. This condition occurs most often in the arms and lower legs. The infection can travel to the muscles, blood, and underlying tissue and become serious. It is very important to get treated for this condition. What are the causes? Cellulitis is caused by bacteria. The bacteria enter through a break in the skin, such as a cut, burn, insect bite, open sore, or crack. What increases the risk? This condition is more likely to occur in people who:  Have a weak body defense system (immune system).  Have open wounds on the skin, such as cuts, burns, bites, and scrapes. Bacteria can enter the body through these open wounds.  Are older than 60 years of age.  Have diabetes.  Have a type of long-lasting (chronic) liver disease (cirrhosis) or kidney disease.  Are obese.  Have a skin condition such as: ? Itchy rash (eczema). ? Slow movement of blood in the veins (venous stasis). ? Fluid buildup below the skin (edema).  Have had radiation therapy.  Use IV drugs. What are the signs or symptoms? Symptoms of this condition include:  Redness, streaking, or spotting on the skin.  Swollen area of the skin.  Tenderness or pain when an area of the skin is touched.  Warm skin.  A fever.  Chills.  Blisters. How is this diagnosed? This condition is diagnosed based on a medical history and physical exam. You may also have tests, including:  Blood tests.  Imaging tests. How is this treated? Treatment for this condition may include:  Medicines, such as antibiotic medicines or medicines to treat allergies (antihistamines).  Supportive care, such as rest and application of cold or warm cloths (compresses) to the skin.  Hospital care, if the condition is severe. The infection usually starts to get better within 1-2 days of treatment. Follow these instructions at  home:  Medicines  Take over-the-counter and prescription medicines only as told by your health care provider.  If you were prescribed an antibiotic medicine, take it as told by your health care provider. Do not stop taking the antibiotic even if you start to feel better. General instructions  Drink enough fluid to keep your urine pale yellow.  Do not touch or rub the infected area.  Raise (elevate) the infected area above the level of your heart while you are sitting or lying down.  Apply warm or cold compresses to the affected area as told by your health care provider.  Keep all follow-up visits as told by your health care provider. This is important. These visits let your health care provider make sure a more serious infection is not developing. Contact a health care provider if:  You have a fever.  Your symptoms do not begin to improve within 1-2 days of starting treatment.  Your bone or joint underneath the infected area becomes painful after the skin has healed.  Your infection returns in the same area or another area.  You notice a swollen bump in the infected area.  You develop new symptoms.  You have a general ill feeling (malaise) with muscle aches and pains. Get help right away if:  Your symptoms get worse.  You feel very sleepy.  You develop vomiting or diarrhea that persists.  You notice red streaks coming from the infected area.  Your red area gets larger or turns dark in color. These symptoms may represent a serious problem that is an   emergency. Do not wait to see if the symptoms will go away. Get medical help right away. Call your local emergency services (911 in the [place] Do not drive yourself to the hospital. Summary  Cellulitis is a skin infection. This condition occurs most often in the arms and lower legs.  Treatment for this condition may include medicines, such as antibiotic medicines or antihistamines.  Take over-the-counter and prescription  medicines only as told by your health care provider. If you were prescribed an antibiotic medicine, do not stop taking the antibiotic even if you start to feel better.  Contact a health care provider if your symptoms do not begin to improve within 1-2 days of starting treatment or your symptoms get worse.  Keep all follow-up visits as told by your health care provider. This is important. These visits let your health care provider make sure that a more serious infection is not developing. This information is not intended to replace advice given to you by your health care provider. Make sure you discuss any questions you have with your health care provider. Document Revised: 12/24/2017 Document Reviewed: 05/09/2019Cephalexin Tablets or Capsules What is this medicine? CEPHALEXIN (sef a LEX in) is a cephalosporin antibiotic. It treats some infections caused by bacteria. It will not work for colds, the flu, or other viruses. This medicine may be used for other purposes; ask your health care provider or pharmacist if you have questions. COMMON BRAND NAME(S): Biocef, Daxbia, Keflex, Keftab What should I tell my health care provider before I take this medicine? They need to know if you have any of these conditions:  kidney disease  stomach or intestine problems, especially colitis  an unusual or allergic reaction to cephalexin, other cephalosporins, penicillins, other antibiotics, medicines, foods, dyes or preservatives  pregnant or trying to get pregnant  breast-feeding How should I use this medicine? Take this drug by mouth. Take it as directed on the prescription label at the same time every day. You can take it with or without food. If it upsets your stomach, take it with food. Take all of this drug unless your health care provider tells you to stop it early. Keep taking it even if you think you are better. Talk to your health care provider about the use of this drug in children. While it may be  prescribed for selected conditions, precautions do apply. Overdosage: If you think you have taken too much of this medicine contact a poison control center or emergency room at once. NOTE: This medicine is only for you. Do not share this medicine with others. What if I miss a dose? If you miss a dose, take it as soon as you can. If it is almost time for your next dose, take only that dose. Do not take double or extra doses. What may interact with this medicine?  probenecid  some other antibiotics This list may not describe all possible interactions. Give your health care provider a list of all the medicines, herbs, non-prescription drugs, or dietary supplements you use. Also tell them if you smoke, drink alcohol, or use illegal drugs. Some items may interact with your medicine. What should I watch for while using this medicine? Tell your doctor or health care provider if your symptoms do not begin to improve in a few days. This medicine may cause serious skin reactions. They can happen weeks to months after starting the medicine. Contact your health care provider right away if you notice fevers or flu-like symptoms with a  rash. The rash may be red or purple and then turn into blisters or peeling of the skin. Or, you might notice a red rash with swelling of the face, lips or lymph nodes in your neck or under your arms. Do not treat diarrhea with over the counter products. Contact your doctor if you have diarrhea that lasts more than 2 days or if it is severe and watery. If you have diabetes, you may get a false-positive result for sugar in your urine. Check with your doctor or health care provider. What side effects may I notice from receiving this medicine? Side effects that you should report to your doctor or health care professional as soon as possible:  allergic reactions like skin rash, itching or hives, swelling of the face, lips, or tongue  breathing problems  pain or trouble passing  urine  redness, blistering, peeling or loosening of the skin, including inside the mouth  severe or watery diarrhea  unusually weak or tired  yellowing of the eyes, skin Side effects that usually do not require medical attention (report to your doctor or health care professional if they continue or are bothersome):  gas or heartburn  genital or anal irritation  headache  joint or muscle pain  nausea, vomiting This list may not describe all possible side effects. Call your doctor for medical advice about side effects. You may report side effects to FDA at 1-800-FDA-1088. Where should I keep my medicine? Keep out of the reach of children and pets. Store at room temperature between 20 and 25 degrees C (68 and 77 degrees F). Throw away any unused drug after the expiration date. NOTE: This sheet is a summary. It may not cover all possible information. If you have questions about this medicine, talk to your doctor, pharmacist, or health care provider.  2020 Elsevier/Gold Standard (2019-03-11 11:27:00)  Elsevier Patient Education  [employer].
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
## 2019-12-20 ENCOUNTER — Ambulatory Visit: Payer: Non-veteran care | Admitting: Physician Assistant

## 2019-12-20 VITALS — BP 126/83 | HR 84 | Temp 98.2°F | Resp 18 | Ht 72.0 in

## 2019-12-20 DIAGNOSIS — N529 Male erectile dysfunction, unspecified: Secondary | ICD-10-CM

## 2019-12-20 DIAGNOSIS — Z13220 Encounter for screening for lipoid disorders: Secondary | ICD-10-CM

## 2019-12-20 DIAGNOSIS — Z1211 Encounter for screening for malignant neoplasm of colon: Secondary | ICD-10-CM

## 2019-12-20 DIAGNOSIS — R6882 Decreased libido: Secondary | ICD-10-CM

## 2019-12-20 DIAGNOSIS — Z125 Encounter for screening for malignant neoplasm of prostate: Secondary | ICD-10-CM

## 2019-12-20 DIAGNOSIS — F411 Generalized anxiety disorder: Secondary | ICD-10-CM

## 2019-12-20 DIAGNOSIS — F4310 Post-traumatic stress disorder, unspecified: Secondary | ICD-10-CM

## 2019-12-20 DIAGNOSIS — Z8639 Personal history of other endocrine, nutritional and metabolic disease: Secondary | ICD-10-CM

## 2019-12-20 NOTE — Progress Notes (Signed)
Established Patient Office Visit  Subjective:  Patient ID: Wesley Alexander, male    DOB: 09/01/1960  Age: 59 y.o. MRN: [id number]  CC:  Chief Complaint  Patient presents with  . Annual Exam    HPI [name] reports that he has previously been treated for hyperthyroidism, states that he has had symptoms of unexplained weight loss, anxiety, irritability.  Reports that he was on medication, states that his symptoms did resolve.  Reports he is no longer taking the medication, does not feel he is having any of the symptoms.  Has gained and maintained his weight.  Reports that he has primary care through the [place], but has been unable to have office appointments due to the coronavirus situation.  Reports that he suffers from anxiety and PTSD which he feels is related to emotional trauma while in the [employer].  Reports that he has difficulty falling asleep and staying asleep, states that he sleeps approximately 4 to 5 hours a night.  Reports that he works on managing his anxiety and PTSD through meditation, exercise, martial arts and avoiding conflict.  Does endorse that he feels he self medicates drinking alcohol and smoking cannabis.  Reports the cannabis does offer relief on falling asleep.  Reports that he has been having difficulty maintaining an erection, states that this has been ongoing for the past year.  Has tried over-the-counter stimulants without relief.  Reports he has never had a colonoscopy, denies family history of colon cancer.  Reports normal soft formed bowel movements.  Reports he eats a healthy diet and maintains physical exercise to stay physically fit.  Reports he is due for eye exam, endorses recent dental exam.  Did not start abx prescribed for cellulitis on left upper thigh yet.  PHQ9 SCORE ONLY 12/20/2019 12/20/2019  Score 11 0    GAD 7 : Generalized Anxiety Score 12/20/2019 12/20/2019  Nervous, Anxious, on Edge 1 0  Control/stop worrying 1 0  Worry too much -  different things 1 0  Trouble relaxing 3 0  Restless 0 0  Easily annoyed or irritable 1 0  Afraid - awful might happen 1 0  Total GAD 7 Score 8 0      Past Medical History:  Diagnosis Date  . Allergy   . Anxiety   . Depression   . Graves disease   . Hypothyroid     Past Surgical History:  Procedure Laterality Date  . KNEE SURGERY     cyst removed/fluid buildup     History reviewed. No pertinent family history.  Social History   Socioeconomic History  . Marital status: Married    Spouse name: Not on file  . Number of children: Not on file  . Years of education: Not on file  . Highest education level: Not on file  Occupational History  . Not on file  Tobacco Use  . Smoking status: Never Smoker  . Smokeless tobacco: Never Used  Substance and Sexual Activity  . Alcohol use: Yes  . Drug use: No  . Sexual activity: Not Currently  Other Topics Concern  . Not on file  Social History Narrative  . Not on file   Social Determinants of Health   Financial Resource Strain:   . Difficulty of Paying Living Expenses:   Food Insecurity:   . Worried About Charity fundraiser in the Last Year:   . Arboriculturist in the Last Year:   Transportation Needs:   .  Lack of Transportation (Medical):   Marland Kitchen Lack of Transportation (Non-Medical):   Physical Activity:   . Days of Exercise per Week:   . Minutes of Exercise per Session:   Stress:   . Feeling of Stress :   Social Connections:   . Frequency of Communication with Friends and Family:   . Frequency of Social Gatherings with Friends and Family:   . Attends Religious Services:   . Active Member of Clubs or Organizations:   . Attends Archivist Meetings:   Marland Kitchen Marital Status:   Intimate Partner Violence:   . Fear of Current or Ex-Partner:   . Emotionally Abused:   Marland Kitchen Physically Abused:   . Sexually Abused:     Outpatient Medications Prior to Visit  Medication Sig Dispense Refill  . acetaminophen (TYLENOL)  500 MG tablet Take 1,000 mg by mouth every 6 (six) hours as needed for moderate pain.    . cephALEXin (KEFLEX) 500 MG capsule Take 1 capsule (500 mg total) by mouth 4 (four) times daily. 20 capsule 0  . ibuprofen (ADVIL,MOTRIN) 200 MG tablet Take 200 mg by mouth every 6 (six) hours as needed. Pain    . cyclobenzaprine (FLEXERIL) 10 MG tablet Take 1 tablet (10 mg total) by mouth 3 (three) times daily as needed for muscle spasms. (Patient not taking: Reported on 12/20/2019) 20 tablet 0  . HYDROcodone-acetaminophen (NORCO/VICODIN) 5-325 MG per tablet Take 1 tablet by mouth every 6 (six) hours as needed for pain. (Patient not taking: Reported on 02/29/2016) 15 tablet 0   No facility-administered medications prior to visit.    Allergies  Allergen Reactions  . Pork-Derived Products Swelling and Rash    ROS Review of Systems  Constitutional: Negative for appetite change, fatigue and unexpected weight change.  HENT: Negative.   Eyes: Negative.   Respiratory: Negative.   Cardiovascular: Negative.   Gastrointestinal: Negative.   Endocrine: Negative for polydipsia and polyuria.  Genitourinary: Negative for difficulty urinating, enuresis and urgency.  Musculoskeletal: Negative.   Skin: Positive for wound.  Allergic/Immunologic: Positive for food allergies.  Neurological: Negative.   Hematological: Negative.   Psychiatric/Behavioral: Positive for dysphoric mood and sleep disturbance. Negative for agitation, self-injury and suicidal ideas. The patient is nervous/anxious.       Objective:    Physical Exam  Constitutional: He is oriented to person, place, and time. He appears well-developed and well-nourished.  HENT:  Head: Normocephalic and atraumatic.  Right Ear: External ear normal.  Left Ear: External ear normal.  Nose: Nose normal.  Mouth/Throat: Oropharynx is clear and moist.  Eyes: Pupils are equal, round, and reactive to light. Conjunctivae and EOM are normal.  Cardiovascular: Normal  rate and regular rhythm.  Pulmonary/Chest: Effort normal and breath sounds normal.  Abdominal: Soft. Bowel sounds are normal.  Musculoskeletal:        General: Normal range of motion.     Cervical back: Normal range of motion and neck supple.  Neurological: He is alert and oriented to person, place, and time. He has normal reflexes.  Skin: Skin is warm and dry.     Psychiatric: He has a normal mood and affect. His behavior is normal. Judgment and thought content normal.  Nursing note and vitals reviewed.   BP 126/83 (BP Location: Left Arm, Patient Position: Sitting, Cuff Size: Normal)   Pulse 84   Temp 98.2 F (36.8 C) (Oral)   Resp 18   Ht 6' (1.829 m)   SpO2 97%   BMI  21.70 kg/m  Wt Readings from Last 3 Encounters:  12/19/19 160 lb (72.6 kg)  11/18/16 134 lb 4 oz (60.9 kg)     Health Maintenance Due  Topic Date Due  . Hepatitis C Screening  Never done  . HIV Screening  Never done  . TETANUS/TDAP  Never done  . COLONOSCOPY  Never done    There are no preventive care reminders to display for this patient.  No results found for: TSH Lab Results  Component Value Date   WBC 5.4 02/29/2016   HGB 13.9 02/29/2016   HCT 41.7 02/29/2016   MCV 83.6 02/29/2016   PLT 265 02/29/2016   Lab Results  Component Value Date   NA 140 02/29/2016   K 4.3 02/29/2016   CO2 26 02/29/2016   GLUCOSE 88 02/29/2016   BUN 11 02/29/2016   CREATININE 0.95 02/29/2016   BILITOT 0.9 05/19/2010   ALKPHOS 56 05/19/2010   AST 30 05/19/2010   ALT 22 05/19/2010   PROT 7.2 05/19/2010   ALBUMIN 4.3 05/19/2010   CALCIUM 9.2 02/29/2016   ANIONGAP 4 (L) 02/29/2016   No results found for: CHOL No results found for: HDL No results found for: LDLCALC No results found for: TRIG No results found for: CHOLHDL No results found for: HGBA1C    Assessment & Plan:   Problem List Items Addressed This Visit    None    1. Screening for lipid disorders   2. Screening for prostate cancer -  PSA  3. Low libido  - CBC with Differential/Platelet - Comp. Metabolic Panel (12) - Testosterone  4. Erectile dysfunction, unspecified erectile dysfunction type  - CBC with Differential/Platelet - Comp. Metabolic Panel (12) - Testosterone  5. Generalized anxiety disorder Gave patient education on disabled American veterans, for assistance in applying for disability through the [place].  Patient does not want to start medication for anxiety at this time, wants to continue lifestyle modifications, gave patient education on benefits of medication versus self-medicating with alcohol and cannabis.  Encouraged trial melatonin over-the-counter for insomnia, improving sleep hygiene.   - CBC with Differential/Platelet - Comp. Metabolic Panel (12) - Vitamin D, 25-hydroxy  6. PTSD (post-traumatic stress disorder)  - CBC with Differential/Platelet - Comp. Metabolic Panel (12) - Vitamin D, 25-hydroxy  7. Screen for colon cancer Patient will return to mobile unit to pick up Cologuard testing kit. - Cologuard  8. History of hyperthyroidism  - Thyroid Panel With TSH   I have reviewed the patient's medical history (PMH, PSH, Social History, Family History, Medications, and allergies) , and have been updated if relevant. I spent 40 minutes reviewing chart and  face to face time with patient.     No orders of the defined types were placed in this encounter.   Follow-up: No follow-ups on file.    Loraine Grip Mayers, PA-C

## 2019-12-20 NOTE — Patient Instructions (Addendum)
Disabled American veterans is the organization that I spoke about they can help you find out your disability claim.  For insomnia, I recommend trying melatonin over-the-counter, you may have to take this several times before it becomes effective.  Aim for 7 to 8 hours of good quality sleep.  I recommend that you take vitamin D 2000 units over-the-counter, we are checking your vitamin D levels today to see if you need an higher dose than that  I submitted an order for Cologuard, we will get the kit for you and call you to come back by the mobile unit to pick it up.  Thank you for trusting [place] with your care!  Kennieth Rad, PA-C Physician Assistant [facility] Medicine [email or web address]   Melatonin oral solid dosage forms What is this medicine? MELATONIN (mel uh TOH nin) is a dietary supplement. It is mostly promoted to help maintain normal sleep patterns. The FDA has not approved this supplement for any medical use. This supplement may be used for other purposes; ask your health care provider or pharmacist if you have questions. This medicine may be used for other purposes; ask your health care provider or pharmacist if you have questions. COMMON BRAND NAME(S): Melatonex What should I tell my health care provider before I take this medicine? They need to know if you have any of these conditions:  cancer  depression or mental illness  diabetes  hormone problems  if you often drink alcohol  immune system problems  liver disease  lung or breathing disease, like asthma  organ transplant  seizure disorder  an unusual or allergic reaction to melatonin, other medicines, foods, dyes, or preservatives  pregnant or trying to get pregnant  breast-feeding How should I use this medicine? Take this supplement by mouth with a glass of water. Do not take with food. This supplement is usually taken 1 or 2 hours before bedtime. After  taking this supplement, limit your activities to those needed to prepare for bed. Some products may be chewed or dissolved in the mouth before swallowing. Some tablets or capsules must be swallowed whole; do not cut, crush or chew. Follow the directions on the package labeling, or take as directed by your health care professional. Do not take this supplement more often than directed. Talk to your pediatrician regarding the use of this supplement in children. Special care may be needed. This supplement is not recommended for use in children without a prescription. Overdosage: If you think you have taken too much of this medicine contact a poison control center or emergency room at once. NOTE: This medicine is only for you. Do not share this medicine with others. What if I miss a dose? If you miss taking your dose at the usual time, skip that dose. If it is almost time for your next dose, take only that dose. Do not take double or extra doses. What may interact with this medicine? Do not take this medicine with any of the following medications:  fluvoxamine  ramelteon  tasimelteon This medicine may also interact with the following medications:  alcohol  caffeine  carbamazepine  certain antibiotics like ciprofloxacin  certain medicines for depression, anxiety, or psychotic disturbances  cimetidine  male hormones, like estrogens and birth control pills, patches, rings, or injections  methoxsalen  nifedipine  other medications for sleep  other herbal or dietary supplements  phenobarbital  rifampin  smoking tobacco  tamoxifen  warfarin This list may not describe all possible interactions. Give  your health care provider a list of all the medicines, herbs, non-prescription drugs, or dietary supplements you use. Also tell them if you smoke, drink alcohol, or use illegal drugs. Some items may interact with your medicine. What should I watch for while using this medicine? See  your doctor if your symptoms do not get better or if they get worse. Do not take this supplement for more than 2 weeks unless your doctor tells you to. You may get drowsy or dizzy. Do not drive, use machinery, or do anything that needs mental alertness until you know how this medicine affects you. Do not stand or sit up quickly, especially if you are an older patient. This reduces the risk of dizzy or fainting spells. Alcohol may interfere with the effect of this medicine. Avoid alcoholic drinks. After taking this medicine, you may get up out of bed and do an activity that you do not know you are doing. The next morning, you may have no memory of this. Activities include driving a car ("sleep-driving"), making and eating food, talking on the phone, sexual activity, and sleep-walking. Serious injuries have occurred. Call your doctor right away if you find out you have done any of these activities. Do not take this medicine if you have used alcohol that evening. Do not take it if you have taken another medicine for sleep. The risk of doing these sleep-related activities is higher. Talk to your doctor before you use this supplement if you are currently being treated for an emotional, mental, or sleep problem. This medicine may interfere with your treatment. Herbal or dietary supplements are not regulated like medicines. Rigid quality control standards are not required for dietary supplements. The purity and strength of these products can vary. The safety and effect of this dietary supplement for a certain disease or illness is not well known. This product is not intended to diagnose, treat, cure or prevent any disease. The Food and Drug Administration suggests the following to help consumers protect themselves:  Always read product labels and follow directions.  Natural does not mean a product is safe for humans to take.  Look for products that include USP after the ingredient name. This means that the  manufacturer followed the standards of the [place] Pharmacopoeia.  Supplements made or sold by a nationally known food or drug company are more likely to be made under tight controls. You can write to the company for more information about how the product was made. What side effects may I notice from receiving this medicine? Side effects that you should report to your doctor or health care professional as soon as possible:  allergic reactions like skin rash, itching or hives, swelling of the face, lips, or tongue  breathing problems  confusion  depressed mood, irritable, or other changes in moods or behaviors  feeling faint or lightheaded, falls  increased blood pressure  irregular or missed menstrual periods  signs and symptoms of liver injury like dark yellow or brown urine; general ill feeling or flu-like symptoms; light-colored stools; loss of appetite; nausea; right upper belly pain; unusually weak or tired; yellowing of the eyes or skin  trouble staying awake or alert during the day  unusual activities while you are still asleep like driving, eating, making phone calls  unusual bleeding or bruising Side effects that usually do not require medical attention (report to your doctor or health care professional if they continue or are bothersome):  dizziness  drowsiness  headache  hot flashes  nausea  tiredness  unusual dreams or nightmares  upset stomach This list may not describe all possible side effects. Call your doctor for medical advice about side effects. You may report side effects to FDA at 1-800-FDA-1088. Where should I keep my medicine? Keep out of the reach of children. Store at room temperature or as directed on the package label. Protect from moisture. Throw away any unused supplement after the expiration date. NOTE: This sheet is a summary. It may not cover all possible information. If you have questions about this medicine, talk to your doctor, pharmacist,  or health care provider.  2020 Elsevier/Gold Standard (2018-01-29 12:11:58)

## 2019-12-21 ENCOUNTER — Other Ambulatory Visit: Payer: Self-pay | Admitting: Physician Assistant

## 2019-12-21 DIAGNOSIS — E559 Vitamin D deficiency, unspecified: Secondary | ICD-10-CM

## 2019-12-21 LAB — COMP. METABOLIC PANEL (12)
AST: 14 IU/L (ref 0–40)
Albumin/Globulin Ratio: 1.7 (ref 1.2–2.2)
Albumin: 4.3 g/dL (ref 3.8–4.9)
Alkaline Phosphatase: 100 IU/L (ref 39–117)
BUN/Creatinine Ratio: 12 (ref 9–20)
BUN: 11 mg/dL (ref 6–24)
Bilirubin Total: <0.2 mg/dL (ref 0.0–1.2)
Calcium: 9.6 mg/dL (ref 8.7–10.2)
Chloride: 107 mmol/L — ABNORMAL HIGH (ref 96–106)
Creatinine, Ser: 0.94 mg/dL (ref 0.76–1.27)
GFR calc Af Amer: 103 mL/min/{1.73_m2} (ref >59)
GFR calc non Af Amer: 89 mL/min/{1.73_m2} (ref >59)
Globulin, Total: 2.6 g/dL (ref 1.5–4.5)
Glucose: 89 mg/dL (ref 65–99)
Potassium: 4.9 mmol/L (ref 3.5–5.2)
Sodium: 142 mmol/L (ref 134–144)
Total Protein: 6.9 g/dL (ref 6.0–8.5)

## 2019-12-21 LAB — CBC WITH DIFFERENTIAL/PLATELET
Basophils Absolute: 0 10*3/uL (ref 0.0–0.2)
Basos: 0 %
EOS (ABSOLUTE): 0.2 10*3/uL (ref 0.0–0.4)
Eos: 3 %
Hematocrit: 48.2 % (ref 37.5–51.0)
Hemoglobin: 15.9 g/dL (ref 13.0–17.7)
Immature Grans (Abs): 0 10*3/uL (ref 0.0–0.1)
Immature Granulocytes: 0 %
Lymphocytes Absolute: 1.5 10*3/uL (ref 0.7–3.1)
Lymphs: 28 %
MCH: 27.8 pg (ref 26.6–33.0)
MCHC: 33 g/dL (ref 31.5–35.7)
MCV: 84 fL (ref 79–97)
Monocytes Absolute: 0.4 10*3/uL (ref 0.1–0.9)
Monocytes: 7 %
Neutrophils Absolute: 3.3 10*3/uL (ref 1.4–7.0)
Neutrophils: 62 %
Platelets: 306 10*3/uL (ref 150–450)
RBC: 5.72 x10E6/uL (ref 4.14–5.80)
RDW: 13.8 % (ref 11.6–15.4)
WBC: 5.4 10*3/uL (ref 3.4–10.8)

## 2019-12-21 LAB — VITAMIN D 25 HYDROXY (VIT D DEFICIENCY, FRACTURES): Vit D, 25-Hydroxy: 19.6 ng/mL — ABNORMAL LOW (ref 30.0–100.0)

## 2019-12-21 LAB — PSA: Prostate Specific Ag, Serum: 1.7 ng/mL (ref 0.0–4.0)

## 2019-12-21 LAB — THYROID PANEL WITH TSH
Free Thyroxine Index: 3 (ref 1.2–4.9)
T3 Uptake Ratio: 31 % (ref 24–39)
T4, Total: 9.7 ug/dL (ref 4.5–12.0)
TSH: <0.005 u[IU]/mL — ABNORMAL LOW (ref 0.450–4.500)

## 2019-12-21 LAB — TESTOSTERONE: Testosterone: 592 ng/dL (ref 264–916)

## 2019-12-21 MED ORDER — VITAMIN D (ERGOCALCIFEROL) 1.25 MG (50000 UNIT) PO CAPS: 50000.0000 [IU] | ORAL_CAPSULE | ORAL | 0 refills | Status: AC

## 2019-12-22 ENCOUNTER — Telehealth: Payer: Self-pay | Admitting: *Deleted

## 2019-12-22 NOTE — Telephone Encounter (Signed)
Patient verified DOB Patient is aware of TSH being low and not starting medication at this time.  Patient aware of starting vitamin d supplement once a week and having levels rechecked in 6 weeks for TSH and 12 weeks for Vitamin D.

## 2019-12-22 NOTE — Telephone Encounter (Signed)
-----   Message from Roney Jaffe, [place] sent at 12/21/2019 10:30 AM EDT ----- On further review of his labs and history, I do recommend that he continue to monitor his symptoms, he is currently not having any hyperthyroid symptoms, he may just be experiencing low TSH from previous thyroiditis, strongly recommend him having it rechecked in 6 weeks.  But not restart medication at this time.

## 2020-05-31 ENCOUNTER — Encounter: Payer: Self-pay | Admitting: Orthopaedic Surgery

## 2020-05-31 ENCOUNTER — Ambulatory Visit (INDEPENDENT_AMBULATORY_CARE_PROVIDER_SITE_OTHER): Payer: No Typology Code available for payment source

## 2020-05-31 ENCOUNTER — Ambulatory Visit (INDEPENDENT_AMBULATORY_CARE_PROVIDER_SITE_OTHER): Payer: No Typology Code available for payment source | Admitting: Orthopaedic Surgery

## 2020-05-31 VITALS — Ht 72.0 in | Wt 160.0 lb

## 2020-05-31 DIAGNOSIS — M25559 Pain in unspecified hip: Secondary | ICD-10-CM | POA: Diagnosis not present

## 2020-05-31 DIAGNOSIS — M25552 Pain in left hip: Secondary | ICD-10-CM | POA: Diagnosis not present

## 2020-05-31 DIAGNOSIS — M25551 Pain in right hip: Secondary | ICD-10-CM

## 2020-05-31 DIAGNOSIS — M1712 Unilateral primary osteoarthritis, left knee: Secondary | ICD-10-CM

## 2020-05-31 DIAGNOSIS — M1711 Unilateral primary osteoarthritis, right knee: Secondary | ICD-10-CM

## 2020-05-31 NOTE — Progress Notes (Signed)
Office Visit Note   Patient: Wesley Alexander           Date of Birth: 06/20/1961           MRN: [id number] Visit Date: 05/31/2020              Requested by: Earl Gala, MD [address] [place],  [place] [address] PCP: Earl Gala, MD   Assessment & Plan: Visit Diagnoses:  1. Hip pain   2. Unilateral primary osteoarthritis, left knee   3. Unilateral primary osteoarthritis, right knee     Plan: I showed the patient in detail his x-rays and do feel that he is a candidate for hip replacement surgery at some point.  I gave him a handout describing this as well.  However, he denies any significant symptoms as it relates to both hips.  I do feel that his hip arthritis is affecting his posture and his back.  I did talk to him about hip replacement surgery but I agree with him that we should hold off on any type of intervention until he becomes more symptomatic.  I did print out a copy of his x-rays for him as well.  All questions and concerns were answered addressed.  Follow-up can be as needed.  Follow-Up Instructions: Return if symptoms worsen or fail to improve.   Orders:  Orders Placed This Encounter  Procedures  . XR HIPS BILAT W OR W/O PELVIS 2V   No orders of the defined types were placed in this encounter.     Procedures: No procedures performed   Clinical Data: No additional findings.   Subjective: No chief complaint on file. The patient is a very pleasant 59 year old gentleman referred from the [place] [facility] to evaluate bilateral hip pain.  He says he has right worse than left hip pain is been going on for several years but physical therapy has been very helpful.  He denies really any groin pain.  He actually points to his lower back and upper pelvis area as a source of his pain more on the right than the left.  He is a very thin individual.  He denies any significant medical problems.  He is not on any blood thinning medications and is not a  smoker.  HPI  Review of Systems He currently denies any headache, chest pain, shortness of breath, fever, chills, nausea, vomiting  Objective: Vital Signs: Ht 6' (1.829 m)   Wt 160 lb (72.6 kg)   BMI 21.70 kg/m   Physical Exam He is alert and oriented x3 and in no acute distress Ortho Exam Examination of both his hips show significant stiffness with attempts of internal and external rotation of both hips.  However, he does not have a lot of pain with his lack of rotation in spite of the severity of arthritis of both hips.  He has good flexion-extension of his lumbar spine.  He has good flexion-extension of both knees as well as foot and ankles. Specialty Comments:  No specialty comments available.  Imaging: XR HIPS BILAT W OR W/O PELVIS 2V  Result Date: 05/31/2020 An AP pelvis and lateral of both hips show severe end-stage arthritis of both hips.  There are large para-articular osteophytes around both hips with severe joint space narrowing and sclerotic changes.  There is flattening of the femoral head bilaterally.    PMFS History: Patient Active Problem List   Diagnosis Date Noted  . Unilateral primary osteoarthritis, left knee  05/31/2020  . Unilateral primary osteoarthritis, right knee 05/31/2020   Past Medical History:  Diagnosis Date  . Allergy   . Anxiety   . Depression   . Graves disease   . Hypothyroid     History reviewed. No pertinent family history.  Past Surgical History:  Procedure Laterality Date  . KNEE SURGERY     cyst removed/fluid buildup    Social History   Occupational History  . Not on file  Tobacco Use  . Smoking status: Never Smoker  . Smokeless tobacco: Never Used  Substance and Sexual Activity  . Alcohol use: Yes  . Drug use: No  . Sexual activity: Not Currently

## 2022-04-02 LAB — GLUCOSE, POCT (MANUAL RESULT ENTRY): POC Glucose: 97 mg/dL (ref 70–99)
